# Patient Record
Sex: Male | Born: 1945 | Race: White | Hispanic: No | Marital: Married | Smoking: Never smoker
Health system: Southern US, Community
[De-identification: ages and names within clinical notes are randomized; demographics above are authoritative.]

## PROBLEM LIST (undated history)

## (undated) DIAGNOSIS — C61 Malignant neoplasm of prostate: Secondary | ICD-10-CM

## (undated) HISTORY — DX: Malignant neoplasm of prostate: C61

---

## 2014-12-30 DIAGNOSIS — E780 Pure hypercholesterolemia: Secondary | ICD-10-CM | POA: Diagnosis not present

## 2014-12-30 DIAGNOSIS — C61 Malignant neoplasm of prostate: Secondary | ICD-10-CM | POA: Diagnosis not present

## 2014-12-30 DIAGNOSIS — I10 Essential (primary) hypertension: Secondary | ICD-10-CM | POA: Diagnosis not present

## 2014-12-30 DIAGNOSIS — R7301 Impaired fasting glucose: Secondary | ICD-10-CM | POA: Diagnosis not present

## 2014-12-30 DIAGNOSIS — J449 Chronic obstructive pulmonary disease, unspecified: Secondary | ICD-10-CM | POA: Diagnosis not present

## 2015-03-05 DIAGNOSIS — J449 Chronic obstructive pulmonary disease, unspecified: Secondary | ICD-10-CM | POA: Diagnosis not present

## 2015-03-05 DIAGNOSIS — Z1211 Encounter for screening for malignant neoplasm of colon: Secondary | ICD-10-CM | POA: Diagnosis not present

## 2015-03-05 DIAGNOSIS — Z0000 Encounter for general adult medical examination without abnormal findings: Secondary | ICD-10-CM | POA: Diagnosis not present

## 2015-04-01 ENCOUNTER — Encounter: Payer: Self-pay | Admitting: Internal Medicine

## 2015-04-01 ENCOUNTER — Ambulatory Visit (INDEPENDENT_AMBULATORY_CARE_PROVIDER_SITE_OTHER): Payer: Commercial Managed Care - HMO | Admitting: Internal Medicine

## 2015-04-01 ENCOUNTER — Ambulatory Visit (INDEPENDENT_AMBULATORY_CARE_PROVIDER_SITE_OTHER)
Admission: RE | Admit: 2015-04-01 | Discharge: 2015-04-01 | Disposition: A | Discharge status: Home / Self Care | Payer: Commercial Managed Care - HMO | Source: Ambulatory Visit | Attending: Internal Medicine | Admitting: Internal Medicine

## 2015-04-01 VITALS — BP 140/98 | HR 85 | Ht 73.0 in | Wt 223.4 lb

## 2015-04-01 DIAGNOSIS — R0600 Dyspnea, unspecified: Secondary | ICD-10-CM

## 2015-04-01 NOTE — Progress Notes (Addendum)
Subjective:    Patient ID: Seth Oconnor, male    DOB: 11-24-1946,    MRN: [id number]  HPI  65 yowm never smoker no respiratory problems whatsoever until admitted to [facility] around 2012 with dx of copd rx spiriva and symbicort and about 50 % better but did not restore him back to nl activity then worse again since summer of 2015 and gradually worse doe since then so referred 04/01/2015  by Dr Olivia Canter for eval sob.   04/01/2015 1st [facility] office visit/ [place]   Chief Complaint  Patient presents with  . Pulmonary Consult    Referred by Dr. Rochel Brome. Pt states that he was dxed with COPD back in 2012. He c/o SOB worsening for the past 2 yrs.  He gets SOB when he goes outside and does anything, including walking to the mailbox. He was walking 5 miles per day approx 3 yrs ago.   doe x mailbox s stopping easier back cause downhill x tenth of a mile at what he considers a nl pace /worse in hot weather since breathing worse has noted more nasal  congestion and some sneeezing but no previous h/o atopy. Baseline wt around 200   Neg cardiac stress test per pt report "did better than they expected"  Never tried using saba before desired activity to see if helped tolerance, uses only after "overdoes it" and sits down.    No obvious  day to day or daytime variabilty or assoc chronic cough or cp or chest tightness, subjective wheeze overt  hb symptoms. No unusual exp hx or h/o childhood pna/ asthma or knowledge of premature birth.  Sleeping ok without nocturnal  or early am exacerbation  of respiratory  c/o's or need for noct saba. Also denies any obvious fluctuation of symptoms with weather or environmental changes or other aggravating or alleviating factors except as outlined above   Current Medications, Allergies, Complete Past Medical History, Past Surgical History, Family History, and Social History were reviewed in [employer] record.          Review of  Systems  Constitutional: Negative for fever, chills, activity change, appetite change and unexpected weight change.  HENT: Positive for congestion and sneezing. Negative for dental problem, postnasal drip, rhinorrhea, sore throat, trouble swallowing and voice change.   Eyes: Negative for visual disturbance.  Respiratory: Positive for shortness of breath. Negative for cough and choking.   Cardiovascular: Negative for chest pain and leg swelling.  Gastrointestinal: Negative for nausea, vomiting and abdominal pain.  Genitourinary: Negative for difficulty urinating.  Musculoskeletal: Negative for arthralgias.  Skin: Positive for rash.  Psychiatric/Behavioral: Negative for behavioral problems and confusion.       Objective:   Physical Exam  amb hoarse wm nad  Wt Readings from Last 3 Encounters:  04/01/15 223 lb 6.4 oz (101.334 kg)    Vital signs reviewed    HEENT: nl dentition, turbinates, and orophanx. Nl external ear canals without cough reflex   NECK :  without JVD/Nodes/TM/ nl carotid upstrokes bilaterally   LUNGS: no acc muscle use, clear to A and P bilaterally without cough on insp or exp maneuvers   CV:  RRR  no s3 or murmur or increase in P2, no edema   ABD:  Obese/ soft and nontender with nl excursion in the supine position. No bruits or organomegaly, bowel sounds nl  MS:  warm without deformities, calf tenderness, cyanosis or clubbing  SKIN: warm and dry  without lesions    NEURO:  alert, approp, no deficits   CXR PA and Lateral:   04/01/2015 :     I personally reviewed images and agree with radiology impression as follows:     No active cardiopulmonary disease.  Labs 02/24/15  nl cbc, lfts chem 6 per Dr Cox's office        Assessment & Plan:

## 2015-04-01 NOTE — Assessment & Plan Note (Addendum)
-    04/01/2015  Walked RA x 3 laps @ 185 ft each stopped due to end of study, no sob or desat -  Spirometry 04/01/2015 > no airflow obst even by fef 25-75  -  04/01/2015 rec trial of gerd rx   Symptoms are markedly disproportionate to objective findings and not clear this is a lung problem but pt does appear to have difficult airway management issues. DDX of  difficult airways management all start with A and  include Adherence, Ace Inhibitors, Acid Reflux, Active Sinus Disease, Alpha 1 Antitripsin deficiency, Anxiety masquerading as Airways dz,  ABPA,  allergy(esp in young), Aspiration (esp in elderly), Adverse effects of DPI,  Active smokers, plus two Bs  = Bronchiectasis and Beta blocker use..and one C= CHF   Adherence is always the initial "prime suspect" and is a multilayered concern that requires a "trust but verify" approach in every patient - starting with knowing how to use medications, especially inhalers, correctly, keeping up with refills and understanding the fundamental difference between maintenance and prns vs those medications only taken for a very short course and then stopped and not refilled.  - The proper method of use, as well as anticipated side effects, of a metered-dose inhaler are discussed and demonstrated to the patient. Improved effectiveness after extensive coaching during this visit to a level of approximately  75% so continue symbicort 160 2bid     ? Acid (or non-acid) GERD > always difficult to exclude as up to 75% of pts in some series report no assoc GI/ Heartburn symptoms> rec max (24h)  acid suppression and diet restrictions/ reviewed and instructions given in writing.    ? Adverse effects of dpi > try off spiriva since he has no copd  ? chf > not specifically excluded at this point   ?anxiety/ depression/ deconditioning > usually dx of exclusion but needs vigorous efforts to build back up to previous level of activity tolerance and if not able to make progress then  cpst

## 2015-04-01 NOTE — Patient Instructions (Addendum)
Stop spiriva - you do not have any copd   Continue symbicort Take 2 puffs first thing in am and then another 2 puffs about 12 hours later.   prilosec 20 mg x 2 (or the 40 mg) Take 30-60 min before first meal of the day   GERD (REFLUX)  is an extremely common cause of respiratory symptoms just like yours , many times with no obvious heartburn at all.    It can be treated with medication, but also with lifestyle changes including avoidance of late meals, excessive alcohol, smoking cessation, and avoid fatty foods, chocolate, peppermint, colas, red wine, and acidic juices such as orange juice.  NO MINT OR MENTHOL PRODUCTS SO NO COUGH DROPS  USE SUGARLESS CANDY INSTEAD (Jolley ranchers or Stover's or Life Savers) or even ice chips will also do - the key is to swallow to prevent all throat clearing. NO OIL BASED VITAMINS - use powdered substitutes.   Weight control is simply a matter of calorie balance which needs to be tilted in your favor by eating less and exercising more.  To get the most out of exercise, you need to be continuously aware that you are short of breath, but never out of breath, for 30 minutes daily. As you improve, it will actually be easier for you to do the same amount of exercise  in  30 minutes so always push to the level where you are short of breath.  If this does not result in gradual weight reduction then I strongly recommend you see a nutritionist with a food diary x 2 weeks so that we can work out a negative calorie balance which is universally effective in steady weight loss programs.  Think of your calorie balance like you do your bank account where in this case you want the balance to go down so you must take in less calories than you burn up.  It's just that simple:  Hard to do, but easy to understand.  Good luck!   Please schedule a follow up office visit in 6 weeks, call sooner if needed with pfts

## 2015-04-01 NOTE — Progress Notes (Signed)
Quick Note:  ATC, NA and no option to leave msg ______ 

## 2015-04-02 NOTE — Progress Notes (Signed)
Quick Note:  Called and spoke to pt. Informed pt of the results and recs per MW. Pt verbalized understanding and denied any further questions or concerns at this time.   ______ 

## 2015-05-13 ENCOUNTER — Ambulatory Visit (INDEPENDENT_AMBULATORY_CARE_PROVIDER_SITE_OTHER): Payer: Commercial Managed Care - HMO | Admitting: Internal Medicine

## 2015-05-13 ENCOUNTER — Encounter: Payer: Self-pay | Admitting: Internal Medicine

## 2015-05-13 VITALS — BP 142/82 | HR 79 | Ht 73.0 in | Wt 211.0 lb

## 2015-05-13 DIAGNOSIS — R0600 Dyspnea, unspecified: Secondary | ICD-10-CM

## 2015-05-13 LAB — PULMONARY FUNCTION TEST
DL/VA % pred: 87 %
DL/VA: 4.18 ml/min/mmHg/L
DLCO UNC % PRED: 84 %
DLCO UNC: 30.7 ml/min/mmHg
FEF 25-75 POST: 4.39 L/s
FEF 25-75 PRE: 3.67 L/s
FEF2575-%CHANGE-POST: 19 %
FEF2575-%PRED-POST: 155 %
FEF2575-%Pred-Pre: 129 %
FEV1-%CHANGE-POST: 4 %
FEV1-%Pred-Post: 112 %
FEV1-%Pred-Pre: 107 %
FEV1-POST: 4.16 L
FEV1-Pre: 3.98 L
FEV1FVC-%Change-Post: 2 %
FEV1FVC-%PRED-PRE: 106 %
FEV6-%Change-Post: 2 %
FEV6-%PRED-POST: 109 %
FEV6-%Pred-Pre: 106 %
FEV6-PRE: 5.02 L
FEV6-Post: 5.17 L
FEV6FVC-%Change-Post: 0 %
FEV6FVC-%PRED-PRE: 104 %
FEV6FVC-%Pred-Post: 105 %
FVC-%Change-Post: 2 %
FVC-%Pred-Post: 103 %
FVC-%Pred-Pre: 101 %
FVC-POST: 5.17 L
FVC-Pre: 5.07 L
POST FEV1/FVC RATIO: 80 %
Post FEV6/FVC ratio: 100 %
Pre FEV1/FVC ratio: 79 %
Pre FEV6/FVC Ratio: 99 %

## 2015-05-13 NOTE — Progress Notes (Signed)
PFT done today. 

## 2015-05-13 NOTE — Assessment & Plan Note (Addendum)
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  Please see instructions for details which were reviewed in writing and the patient given a copy.    5) pulmonary f/u is prn

## 2015-05-13 NOTE — Patient Instructions (Addendum)
Ok to try wean the symbicort first by dropping the pm dose, then reduce the am dose to one pff daily for a couple of weeks before stopping   Keep up the weight loss    If you are satisfied with your treatment plan,  let your doctor know and he/she can either refill your medications or you can return here when your prescription runs out.     If in any way you are not 100% satisfied,  please tell us.  If 100% better, tell your friends!  Pulmonary follow up is as needed

## 2015-05-13 NOTE — Progress Notes (Addendum)
Subjective:    Patient ID: Seth Oconnor, male    DOB: [birth year]    MRN: [id number]    Brief patient profile:  76 yowm never smoker no respiratory problems whatsoever until admitted to [facility] around 2012 with dx of copd rx spiriva and symbicort and about 50 % better but did not restore him back to nl activity then worse again since summer of 2015 and gradually worse doe since then so referred 04/01/2015  by Dr Olivia Canter for eval sob with no evidence of  Airflow obst at all by pfts 05/13/2015     History of Present Illness  04/01/2015 1st [facility] office visit/ [place]   Chief Complaint  Patient presents with  . Pulmonary Consult    Referred by Dr. Rochel Brome. Pt states that he was dxed with COPD back in 2012. He c/o SOB worsening for the past 2 yrs.  He gets SOB when he goes outside and does anything, including walking to the mailbox. He was walking 5 miles per day approx 3 yrs ago.   doe x mailbox s stopping easier back cause downhill x tenth of a mile at what he considers a nl pace /worse in hot weather since breathing worse has noted more nasal  congestion and some sneeezing but no previous h/o atopy. Baseline wt around 200   Neg cardiac stress test per pt report "did better than they expected"  Never tried using saba before desired activity to see if helped tolerance, uses only after "overdoes it" and sits down.   rec Stop spiriva - you do not have any copd  Continue symbicort Take 2 puffs first thing in am and then another 2 puffs about 12 hours later.  Prilosec 20 mg x 2 (or the 40 mg) Take 30-60 min before first meal of the day  GERD diet Try for neg cal bal    05/13/2015 f/u ov/Jaelynn Pozo re: unexplained doe better on gerd rx and off spiriva/ still on symbicort 160 2bid  Chief Complaint  Patient presents with  . Follow-up    Pt states his breathing has improved back to his normal baseline. He states that he is back to walking 5 miles per day.   never using the saba in any  form   Not limited by breathing from desired activities    No obvious day to day or daytime variabilty or assoc chronic cough or cp or chest tightness, subjective wheeze overt sinus or hb symptoms. No unusual exp hx or h/o childhood pna/ asthma or knowledge of premature birth.  Sleeping ok without nocturnal  or early am exacerbation  of respiratory  c/o's or need for noct saba. Also denies any obvious fluctuation of symptoms with weather or environmental changes or other aggravating or alleviating factors except as outlined above   Current Medications, Allergies, Complete Past Medical History, Past Surgical History, Family History, and Social History were reviewed in [employer] record.  ROS  The following are not active complaints unless bolded sore throat, dysphagia, dental problems, itching, sneezing,  nasal congestion or excess/ purulent secretions, ear ache,   fever, chills, sweats, unintended wt loss, pleuritic or exertional cp, hemoptysis,  orthopnea pnd or leg swelling, presyncope, palpitations, heartburn, abdominal pain, anorexia, nausea, vomiting, diarrhea  or change in bowel or urinary habits, change in stools or urine, dysuria,hematuria,  rash, arthralgias, visual complaints, headache, numbness weakness or ataxia or problems with walking or coordination,  change in mood/affect or memory.  Objective:   Physical Exam  amb hoarse wm nad  Wt Readings from Last 3 Encounters:  05/13/15 211 lb (95.709 kg)  04/01/15 223 lb 6.4 oz (101.334 kg)    Vital signs reviewed    HEENT: nl dentition, turbinates, and orophanx. Nl external ear canals without cough reflex   NECK :  without JVD/Nodes/TM/ nl carotid upstrokes bilaterally   LUNGS: no acc muscle use, clear to A and P bilaterally without cough on insp or exp maneuvers   CV:  RRR  no s3 or murmur or increase in P2, no edema   ABD:  Obese/ soft and nontender with nl excursion in the supine  position. No bruits or organomegaly, bowel sounds nl  MS:  warm without deformities, calf tenderness, cyanosis or clubbing  SKIN: warm and dry without lesions    NEURO:  alert, approp, no deficits   CXR PA and Lateral:   04/01/2015 :     I personally reviewed images and agree with radiology impression as follows:     No active cardiopulmonary disease.  Labs 02/24/15  nl cbc, lfts chem 6 per Dr Cox's office        Assessment & Plan:   Outpatient Encounter Prescriptions as of 05/13/2015  Medication Sig  . albuterol (PROVENTIL HFA;VENTOLIN HFA) 108 (90 BASE) MCG/ACT inhaler Inhale 2 puffs into the lungs every 6 (six) hours as needed for wheezing or shortness of breath.  Marland Kitchen albuterol (PROVENTIL) (2.5 MG/3ML) 0.083% nebulizer solution Take 2.5 mg by nebulization every 6 (six) hours as needed for wheezing or shortness of breath.  Marland Kitchen aspirin 81 MG tablet Take 81 mg by mouth daily.  . budesonide-formoterol (SYMBICORT) 160-4.5 MCG/ACT inhaler Inhale 2 puffs into the lungs 2 (two) times daily.  . Cholecalciferol (VITAMIN D3) 5000 UNITS TABS Take 1 tablet by mouth daily.  Marland Kitchen escitalopram (LEXAPRO) 10 MG tablet Take 10 mg by mouth daily.  . finasteride (PROSCAR) 5 MG tablet Take 5 mg by mouth daily.  . Multiple Vitamin (MULTIVITAMIN) capsule Take 1 capsule by mouth daily.  Marland Kitchen omeprazole (PRILOSEC) 20 MG capsule Take 40 mg by mouth daily.  . tamsulosin (FLOMAX) 0.4 MG CAPS capsule Take 0.4 mg by mouth daily.  . Vitamins-Lipotropics (LIPO-FLAVONOID PLUS) TABS Take 3 tablets by mouth daily.  . [DISCONTINUED] losartan (COZAAR) 25 MG tablet Take 25 mg by mouth daily.  . [DISCONTINUED] omeprazole (PRILOSEC) 20 MG capsule Take 20 mg by mouth daily.   No facility-administered encounter medications on file as of 05/13/2015.

## 2015-05-26 ENCOUNTER — Institutional Professional Consult (permissible substitution): Payer: Self-pay | Admitting: Critical Care Medicine

## 2015-10-20 DIAGNOSIS — H259 Unspecified age-related cataract: Secondary | ICD-10-CM | POA: Diagnosis not present

## 2015-10-20 DIAGNOSIS — Z0100 Encounter for examination of eyes and vision without abnormal findings: Secondary | ICD-10-CM | POA: Diagnosis not present

## 2015-12-31 DIAGNOSIS — M84364A Stress fracture, left fibula, initial encounter for fracture: Secondary | ICD-10-CM | POA: Diagnosis not present

## 2016-03-09 DIAGNOSIS — Z0000 Encounter for general adult medical examination without abnormal findings: Secondary | ICD-10-CM | POA: Diagnosis not present

## 2016-03-09 DIAGNOSIS — Z6827 Body mass index (BMI) 27.0-27.9, adult: Secondary | ICD-10-CM | POA: Diagnosis not present

## 2016-05-03 DIAGNOSIS — J301 Allergic rhinitis due to pollen: Secondary | ICD-10-CM | POA: Diagnosis not present

## 2016-07-13 DIAGNOSIS — L403 Pustulosis palmaris et plantaris: Secondary | ICD-10-CM | POA: Diagnosis not present

## 2016-08-12 ENCOUNTER — Encounter: Payer: Self-pay | Admitting: Internal Medicine

## 2016-08-12 ENCOUNTER — Ambulatory Visit (INDEPENDENT_AMBULATORY_CARE_PROVIDER_SITE_OTHER): Payer: Commercial Managed Care - HMO | Admitting: Internal Medicine

## 2016-08-12 VITALS — BP 130/68 | HR 97 | Temp 97.6°F | Ht 73.0 in | Wt 209.6 lb

## 2016-08-12 DIAGNOSIS — J45901 Unspecified asthma with (acute) exacerbation: Secondary | ICD-10-CM

## 2016-08-12 DIAGNOSIS — R0600 Dyspnea, unspecified: Secondary | ICD-10-CM

## 2016-08-12 LAB — NITRIC OXIDE: Nitric Oxide: 180

## 2016-08-12 MED ORDER — BUDESONIDE-FORMOTEROL FUMARATE 160-4.5 MCG/ACT IN AERO: 2.0000 | INHALATION_SPRAY | Freq: Two times a day (BID) | RESPIRATORY_TRACT | 0 refills | Status: AC

## 2016-08-12 MED ORDER — PREDNISONE 10 MG PO TABS: ORAL_TABLET | ORAL | 0 refills | Status: DC

## 2016-08-12 NOTE — Patient Instructions (Addendum)
prilosec should be Take 30-60 min before first meal of the day and add pepcid 20 mg at bedtime   Work on inhaler technique:  relax and gently blow all the way out then take a nice smooth deep breath back in, triggering the inhaler at same time you start breathing in.  Hold for up to 5 seconds if you can. Blow out thru nose. Rinse and gargle with water when done      Plan A = Automatic = Symbicort 160 up to 2 pffs every 12 hours                                     Prednisone 10 mg take  4 each am x 2 days,   2 each am x 2 days,  1 each am x 2 days and stop   Plan B = Backup Only use your albuterol as a rescue medication to be used if you can't catch your breath by resting or doing a relaxed purse lip breathing pattern.  - The less you use it, the better it will work when you need it. - Ok to use the inhaler up to 2 puffs  every 4 hours if you must but call for appointment if use goes up over your usual need - Don't leave home without it !!  (think of it like the spare tire for your car)   Plan C = Crisis - only use your albuterol nebulizer if you first try Plan B and it fails to help > ok to use the nebulizer up to every 4 hours but if start needing it regularly call for immediate appointment   Please schedule a follow up office visit in 6 weeks, call sooner if needed

## 2016-08-12 NOTE — Progress Notes (Signed)
Subjective:    Patient ID: Seth Oconnor, male    DOB: [birth year]    MRN: [id number]    Brief patient profile:  32 yowm never smoker no respiratory problems whatsoever until admitted to [facility] around 2012 with dx of copd rx spiriva and symbicort and about 50 % better but did not restore him back to nl activity then worse again since summer of 2015 and gradually worse doe since then so referred 04/01/2015  by Seth Oconnor for eval sob with no evidence of  Airflow obst at all by pfts 05/13/2015     History of Present Illness  04/01/2015 1st [facility] office visit/ Seth Oconnor   Chief Complaint  Patient presents with  . Pulmonary Consult    Referred by Seth Oconnor. Pt states that he was dxed with COPD back in 2012. He c/o SOB worsening for the past 2 yrs.  He gets SOB when he goes outside and does anything, including walking to the mailbox. He was walking 5 miles per day approx 3 yrs ago.   doe x mailbox s stopping easier back cause downhill x tenth of a mile at what he considers a nl pace /worse in hot weather since breathing worse has noted more nasal  congestion and some sneeezing but no previous h/o atopy. Baseline wt around 200   Neg cardiac stress test per pt report "did better than they expected"  Never tried using saba before desired activity to see if helped tolerance, uses only after "overdoes it" and sits down.   rec Stop spiriva - you do not have any copd  Continue symbicort Take 2 puffs first thing in am and then another 2 puffs about 12 hours later.  Prilosec 20 mg x 2 (or the 40 mg) Take 30-60 min before first meal of the day  GERD diet Try for neg cal bal    05/13/2015 f/u ov/Seth Oconnor re: unexplained doe better on gerd rx and off spiriva/ still on symbicort 160 2bid  Chief Complaint  Patient presents with  . Follow-up    Pt states his breathing has improved back to his normal baseline. He states that he is back to walking 5 miles per day.   never using the saba in any  form  Not limited by breathing from desired activities   rec Ok to try wean the symbicort first by dropping the pm dose, then reduce the am dose to one pff daily for a couple of weeks before stopping  Keep up the weight loss    08/12/2016 acute extended ov/Seth Oconnor re: recurrent cough x early July 2017 Chief Complaint  Patient presents with  . Acute Visit    c/o worsening prod cough with clear mucus qhs, sob.  S/s present X1 month.     onset was x one month prior to [place] with nasal congestion / throat congestion rec zyrtec by PC > not better Taking ppi p supper  Coughs so hard at hs loses breath   No obvious day to day or daytime variability or assoc   purulent sputum or mucus plugs or hemoptysis or cp or chest tightness, subjective wheeze or overt sinus or hb symptoms. No unusual exp hx or h/o childhood pna/ asthma or knowledge of premature birth.   Also denies any obvious fluctuation of symptoms with weather or environmental changes or other aggravating or alleviating factors except as outlined above   Current Medications, Allergies, Complete Past Medical History, Past Surgical  History, Family History, and Social History were reviewed in [employer] record.  ROS  The following are not active complaints unless bolded sore throat, dysphagia, dental problems, itching, sneezing,  nasal congestion or excess/ purulent secretions, ear ache,   fever, chills, sweats, unintended wt loss, classically pleuritic or exertional cp,  orthopnea pnd or leg swelling, presyncope, palpitations, abdominal pain, anorexia, nausea, vomiting, diarrhea  or change in bowel or bladder habits, change in stools or urine, dysuria,hematuria,  rash, arthralgias, visual complaints, headache, numbness, weakness or ataxia or problems with walking or coordination,  change in mood/affect or memory.                 Objective:   Physical Exam  amb hoarse wm nad/ slight nasal tone to voice    08/12/2016        209   05/13/15 211 lb (95.709 kg)  04/01/15 223 lb 6.4 oz (101.334 kg)    Vital signs reviewed    HEENT: nl dentition, turbinates, and orophanx. Nl external ear canals without cough reflex   NECK :  without JVD/Nodes/TM/ nl carotid upstrokes bilaterally   LUNGS: no acc muscle use, prominent pseudowheeze with lots of transmitted upper airway noises on insp/ true wheeze on exp bilaterally      CV:  RRR  no s3 or murmur or increase in P2, no edema   ABD:  Obese/ soft and nontender with nl excursion in the supine position. No bruits or organomegaly, bowel sounds nl  MS:  warm without deformities, calf tenderness, cyanosis or clubbing  SKIN: warm and dry without lesions    NEURO:  alert, approp, no deficits           Assessment & Plan:

## 2016-08-14 ENCOUNTER — Encounter: Payer: Self-pay | Admitting: Internal Medicine

## 2016-08-14 DIAGNOSIS — J45901 Unspecified asthma with (acute) exacerbation: Secondary | ICD-10-CM | POA: Insufficient documentation

## 2016-08-14 NOTE — Assessment & Plan Note (Addendum)
-    04/01/2015  Walked RA x 3 laps @ 185 ft each stopped due to end of study, no sob or desat -  Spirometry 04/01/2015 > no airflow obst even by fef 25-75  -  04/01/2015 rec trial of gerd rx - PFT  05/13/2015  wnl > ok to wean off symbicort > flared with def airflow obst 08/12/2016 - see asthma   Clearly he has two different problems as prev c/o doe with nl pfts so rec maint rx for gerd and regroup in 4 weeks to sort out the components of his ongoing chronic doe.

## 2016-08-14 NOTE — Assessment & Plan Note (Addendum)
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 so rec  Prednisone 10 mg take  4 each am x 2 days,   2 each am x 2 days,  1 each am x 2 days and stop Resume symbicort 160 2bid  Max rx for gerd for now  F/u in 6 weeks  I had an extended discussion with the patient reviewing all relevant studies completed to date and  lasting 15 to 20 minutes of a 25 minute visit    Each maintenance medication was reviewed in detail including most importantly the difference between maintenance and prns and under what circumstances the prns are to be triggered using an action plan format that is not reflected in the computer generated alphabetically organized AVS.    Please see instructions for details which were reviewed in writing and the patient given a copy highlighting the part that I personally wrote and discussed at today's ov.

## 2016-08-31 DIAGNOSIS — C61 Malignant neoplasm of prostate: Secondary | ICD-10-CM | POA: Diagnosis not present

## 2016-09-07 DIAGNOSIS — C61 Malignant neoplasm of prostate: Secondary | ICD-10-CM | POA: Diagnosis not present

## 2016-09-09 DIAGNOSIS — C439 Malignant melanoma of skin, unspecified: Secondary | ICD-10-CM | POA: Diagnosis not present

## 2016-09-09 DIAGNOSIS — C61 Malignant neoplasm of prostate: Secondary | ICD-10-CM | POA: Diagnosis not present

## 2016-09-23 ENCOUNTER — Ambulatory Visit (INDEPENDENT_AMBULATORY_CARE_PROVIDER_SITE_OTHER): Payer: Commercial Managed Care - HMO | Admitting: Internal Medicine

## 2016-09-23 ENCOUNTER — Encounter: Payer: Self-pay | Admitting: Internal Medicine

## 2016-09-23 VITALS — BP 112/80 | HR 82 | Ht 73.0 in | Wt 213.4 lb

## 2016-09-23 DIAGNOSIS — J45901 Unspecified asthma with (acute) exacerbation: Secondary | ICD-10-CM | POA: Diagnosis not present

## 2016-09-23 NOTE — Assessment & Plan Note (Signed)
-    04/01/2015  Walked RA x 3 laps @ 185 ft each stopped due to end of study, no sob or desat -  Spirometry 04/01/2015 > no airflow obst even by fef 25-75  -  04/01/2015 rec trial of gerd rx - PFT  05/13/2015  wnl > ok to wean off symbicort   - Spirometry 08/12/2016  FEV1 2.08 (56%)  Ratio 63 with typical curve - FENO 08/12/2016  =   180    08/12/2016   resume symbicort 160  - 09/23/2016  After extensive coaching HFA effectiveness =    90%    All goals of chronic asthma control met including optimal function and elimination of symptoms with minimal need for rescue therapy.  Contingencies discussed in full including contacting this office immediately if not controlling the symptoms using the rule of two's.      I had an extended discussion with the patient reviewing all relevant studies completed to date and  lasting 15 to 20 minutes of a 25 minute visit on the following ongoing concerns:   1) side effects of ICS vs pred reviewed : The goal with a chronic steroid dependent illness is always arriving at the lowest effective dose that controls the disease/symptoms and not accepting a set "formula" which is based on statistics or guidelines that don't always take into account patient  variability or the natural hx of the dz in every individual patient, which may well vary over time.  For now therefore I recommend the patient maintain  Off systemic steroids and on lowest effective dose of ICS > for now symb 160 2 each am then 2 in pm prn (concerned with cataracts)   2) Each maintenance medication was reviewed in detail including most importantly the difference between maintenance and as needed and under what circumstances the prns are to be used.  Please see instructions for details which were reviewed in writing and the patient given a copy.

## 2016-09-23 NOTE — Progress Notes (Signed)
Subjective:    Patient ID: Seth Oconnor, male    DOB: August 21, 1946    MRN: [id number]    Brief patient profile:  68 yowm never smoker no respiratory problems whatsoever until admitted to [facility] around 2012 with dx of copd rx spiriva and symbicort and about 50 % better but did not restore him back to nl activity then worse again since summer of 2015 and gradually worse doe since then so referred 04/01/2015  by Dr Olivia Canter for eval sob with no evidence of  Airflow obst at all by pfts 05/13/2015     History of Present Illness  04/01/2015 1st [facility] office visit/ [facility]   Chief Complaint  Patient presents with  . Pulmonary Consult    Referred by Dr. Rochel Brome. Pt states that he was dxed with COPD back in 2012. He c/o SOB worsening for the past 2 yrs.  He gets SOB when he goes outside and does anything, including walking to the mailbox. He was walking 5 miles per day approx 3 yrs ago.   doe x mailbox s stopping easier back cause downhill x tenth of a mile at what he considers a nl pace /worse in hot weather since breathing worse has noted more nasal  congestion and some sneeezing but no previous h/o atopy. Baseline wt around 200   Neg cardiac stress test per pt report "did better than they expected"  Never tried using saba before desired activity to see if helped tolerance, uses only after "overdoes it" and sits down.   rec Stop spiriva - you do not have any copd  Continue symbicort Take 2 puffs first thing in am and then another 2 puffs about 12 hours later.  Prilosec 20 mg x 2 (or the 40 mg) Take 30-60 min before first meal of the day  GERD diet Try for neg cal bal    05/13/2015 f/u ov/Wert re: unexplained doe better on gerd rx and off spiriva/ still on symbicort 160 2bid  Chief Complaint  Patient presents with  . Follow-up    Pt states his breathing has improved back to his normal baseline. He states that he is back to walking 5 miles per day.   never using the saba in any  form  Not limited by breathing from desired activities   rec Ok to try wean the symbicort first by dropping the pm dose, then reduce the am dose to one pff daily for a couple of weeks before stopping  Keep up the weight loss    08/12/2016 acute extended ov/Wert re: recurrent cough x early July 2017/ on no inhalers  Chief Complaint  Patient presents with  . Acute Visit    c/o worsening prod cough with clear mucus qhs, sob.  S/s present X1 month.     onset was x one month prior to OV  Assoc with nasal congestion / throat congestion rec zyrtec by PC > not better Taking ppi p supper  Coughs so hard at hs loses breath  rec prilosec should be Take 30-60 min before first meal of the day and add pepcid 20 mg at bedtime  Work on inhaler technique:      Plan A = Automatic = Symbicort 160 up to 2 pffs every 12 hours  Prednisone 10 mg take  4 each am x 2 days,   2 each am x 2 days,  1 each am x 2 days and stop  Plan B = Backup Only use your albuterol as a rescue medication  Plan C = Crisis - only use your albuterol nebulizer if you first try Plan B    09/23/2016  f/u ov/Wert re: chronic asthma/  Chief Complaint  Patient presents with  . Follow-up    Breathing has improved and cough has basically resolved. No new co's today.    Not limited by breathing from desired activities      No obvious day to day or daytime variability or assoc excess/  purulent sputum or mucus plugs or hemoptysis or cp or chest tightness, subjective wheeze or overt sinus or hb symptoms. No unusual exp hx or h/o childhood pna/ asthma or knowledge of premature birth.   Also denies any obvious fluctuation of symptoms with weather or environmental changes or other aggravating or alleviating factors except as outlined above   Current Medications, Allergies, Complete Past Medical History, Past Surgical History, Family History, and Social History were reviewed in [employer] record.  ROS  The following are not active complaints unless bolded sore throat, dysphagia, dental problems, itching, sneezing,  nasal congestion or excess/ purulent secretions, ear ache,   fever, chills, sweats, unintended wt loss, classically pleuritic or exertional cp,  orthopnea pnd or leg swelling, presyncope, palpitations, abdominal pain, anorexia, nausea, vomiting, diarrhea  or change in bowel or bladder habits, change in stools or urine, dysuria,hematuria,  rash, arthralgias, visual complaints, headache, numbness, weakness or ataxia or problems with walking or coordination,  change in mood/affect or memory.                 Objective:   Physical Exam  amb hoarse wm nad/ slight nasal tone to voice    09/23/2016        214 08/12/2016        209   05/13/15 211 lb (95.709 kg)  04/01/15 223 lb 6.4 oz (101.334 kg)    Vital signs reviewed    HEENT: nl dentition, turbinates, and orophanx. Nl external ear canals without cough reflex   NECK :  without JVD/Nodes/TM/ nl carotid upstrokes bilaterally   LUNGS: no acc muscle use, clear to A and P    CV:  RRR  no s3 or murmur or increase in P2, no edema   ABD:  Obese/ soft and nontender with nl excursion in the supine position. No bruits or organomegaly, bowel sounds nl  MS:  warm without deformities, calf tenderness, cyanosis or clubbing  SKIN: warm and dry without lesions    NEURO:  alert, approp, no deficits           Assessment & Plan:

## 2016-09-23 NOTE — Patient Instructions (Signed)
   Plan A = Automatic = Symbicort 160 up to 2 pffs first thing in am and repeat in evening only if having respiratory symptoms     Plan B = Backup Only use your albuterol as a rescue medication to be used if you can't catch your breath by resting or doing a relaxed purse lip breathing pattern.  - The less you use it, the better it will work when you need it. - Ok to use the inhaler up to 2 puffs  every 4 hours if you must but call for appointment if use goes up over your usual need - Don't leave home without it !!  (think of it like the spare tire for your car)   Plan C = Crisis - only use your albuterol nebulizer if you first try Plan B and it fails to help > ok to use the nebulizer up to every 4 hours but if start needing it regularly call for immediate appointment   Please schedule a follow up visit in 3 months but call sooner if needed

## 2016-12-01 DIAGNOSIS — M25531 Pain in right wrist: Secondary | ICD-10-CM | POA: Diagnosis not present

## 2016-12-06 DIAGNOSIS — M25531 Pain in right wrist: Secondary | ICD-10-CM | POA: Diagnosis not present

## 2016-12-08 DIAGNOSIS — M25531 Pain in right wrist: Secondary | ICD-10-CM | POA: Diagnosis not present

## 2016-12-13 DIAGNOSIS — M25531 Pain in right wrist: Secondary | ICD-10-CM | POA: Diagnosis not present

## 2016-12-15 DIAGNOSIS — M25531 Pain in right wrist: Secondary | ICD-10-CM | POA: Diagnosis not present

## 2016-12-22 DIAGNOSIS — M25531 Pain in right wrist: Secondary | ICD-10-CM | POA: Diagnosis not present

## 2016-12-23 ENCOUNTER — Ambulatory Visit: Payer: Commercial Managed Care - HMO | Admitting: Internal Medicine

## 2016-12-27 DIAGNOSIS — M25531 Pain in right wrist: Secondary | ICD-10-CM | POA: Diagnosis not present

## 2016-12-28 ENCOUNTER — Ambulatory Visit: Payer: Commercial Managed Care - HMO | Admitting: Internal Medicine

## 2017-01-03 DIAGNOSIS — M25531 Pain in right wrist: Secondary | ICD-10-CM | POA: Diagnosis not present

## 2017-01-06 DIAGNOSIS — M25531 Pain in right wrist: Secondary | ICD-10-CM | POA: Diagnosis not present

## 2017-01-10 DIAGNOSIS — M25531 Pain in right wrist: Secondary | ICD-10-CM | POA: Diagnosis not present

## 2017-01-13 DIAGNOSIS — M25531 Pain in right wrist: Secondary | ICD-10-CM | POA: Diagnosis not present

## 2017-01-17 DIAGNOSIS — M25531 Pain in right wrist: Secondary | ICD-10-CM | POA: Diagnosis not present

## 2017-01-19 DIAGNOSIS — M25531 Pain in right wrist: Secondary | ICD-10-CM | POA: Diagnosis not present

## 2017-01-24 DIAGNOSIS — M25531 Pain in right wrist: Secondary | ICD-10-CM | POA: Diagnosis not present

## 2017-01-31 DIAGNOSIS — M25531 Pain in right wrist: Secondary | ICD-10-CM | POA: Diagnosis not present

## 2017-02-02 DIAGNOSIS — M25531 Pain in right wrist: Secondary | ICD-10-CM | POA: Diagnosis not present

## 2017-02-07 DIAGNOSIS — M25531 Pain in right wrist: Secondary | ICD-10-CM | POA: Diagnosis not present

## 2017-02-09 DIAGNOSIS — M25531 Pain in right wrist: Secondary | ICD-10-CM | POA: Diagnosis not present

## 2017-02-14 DIAGNOSIS — M25531 Pain in right wrist: Secondary | ICD-10-CM | POA: Diagnosis not present

## 2017-02-16 DIAGNOSIS — M25531 Pain in right wrist: Secondary | ICD-10-CM | POA: Diagnosis not present

## 2017-02-21 DIAGNOSIS — M25531 Pain in right wrist: Secondary | ICD-10-CM | POA: Diagnosis not present

## 2017-02-22 DIAGNOSIS — R070 Pain in throat: Secondary | ICD-10-CM | POA: Diagnosis not present

## 2017-02-22 DIAGNOSIS — J019 Acute sinusitis, unspecified: Secondary | ICD-10-CM | POA: Diagnosis not present

## 2017-02-22 DIAGNOSIS — R51 Headache: Secondary | ICD-10-CM | POA: Diagnosis not present

## 2017-02-22 DIAGNOSIS — R05 Cough: Secondary | ICD-10-CM | POA: Diagnosis not present

## 2017-02-23 DIAGNOSIS — M25531 Pain in right wrist: Secondary | ICD-10-CM | POA: Diagnosis not present

## 2017-02-27 DIAGNOSIS — H35363 Drusen (degenerative) of macula, bilateral: Secondary | ICD-10-CM | POA: Diagnosis not present

## 2017-02-27 DIAGNOSIS — H25813 Combined forms of age-related cataract, bilateral: Secondary | ICD-10-CM | POA: Diagnosis not present

## 2017-02-28 DIAGNOSIS — M25531 Pain in right wrist: Secondary | ICD-10-CM | POA: Diagnosis not present

## 2017-03-02 DIAGNOSIS — M25531 Pain in right wrist: Secondary | ICD-10-CM | POA: Diagnosis not present

## 2017-03-07 DIAGNOSIS — M25531 Pain in right wrist: Secondary | ICD-10-CM | POA: Diagnosis not present

## 2017-03-09 DIAGNOSIS — M25531 Pain in right wrist: Secondary | ICD-10-CM | POA: Diagnosis not present

## 2017-03-11 IMAGING — CR DG CHEST 2V
2 series · 2 of 2 positions shown · non-contrast
Comparison: None.

CLINICAL DATA: Dyspnea.

EXAM:
CHEST  2 VIEW

[view not recorded (1 of 2)]
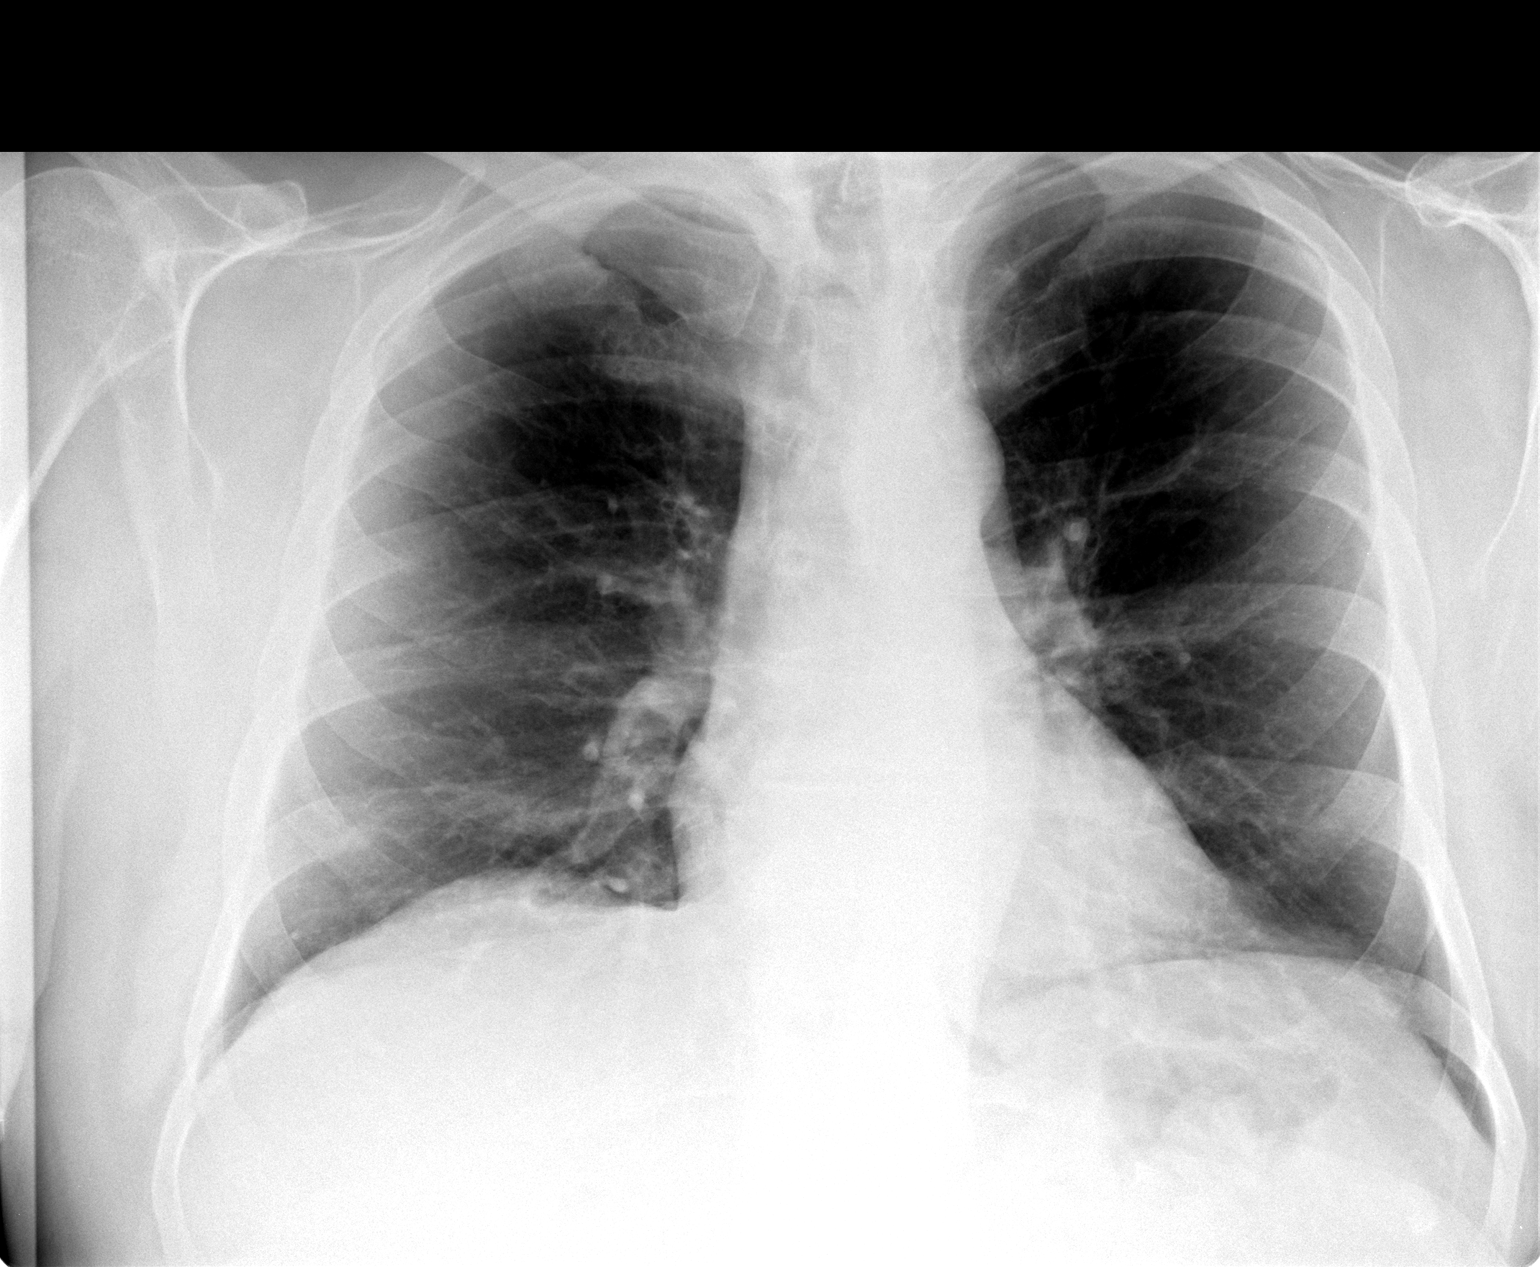

[view not recorded (2 of 2)]
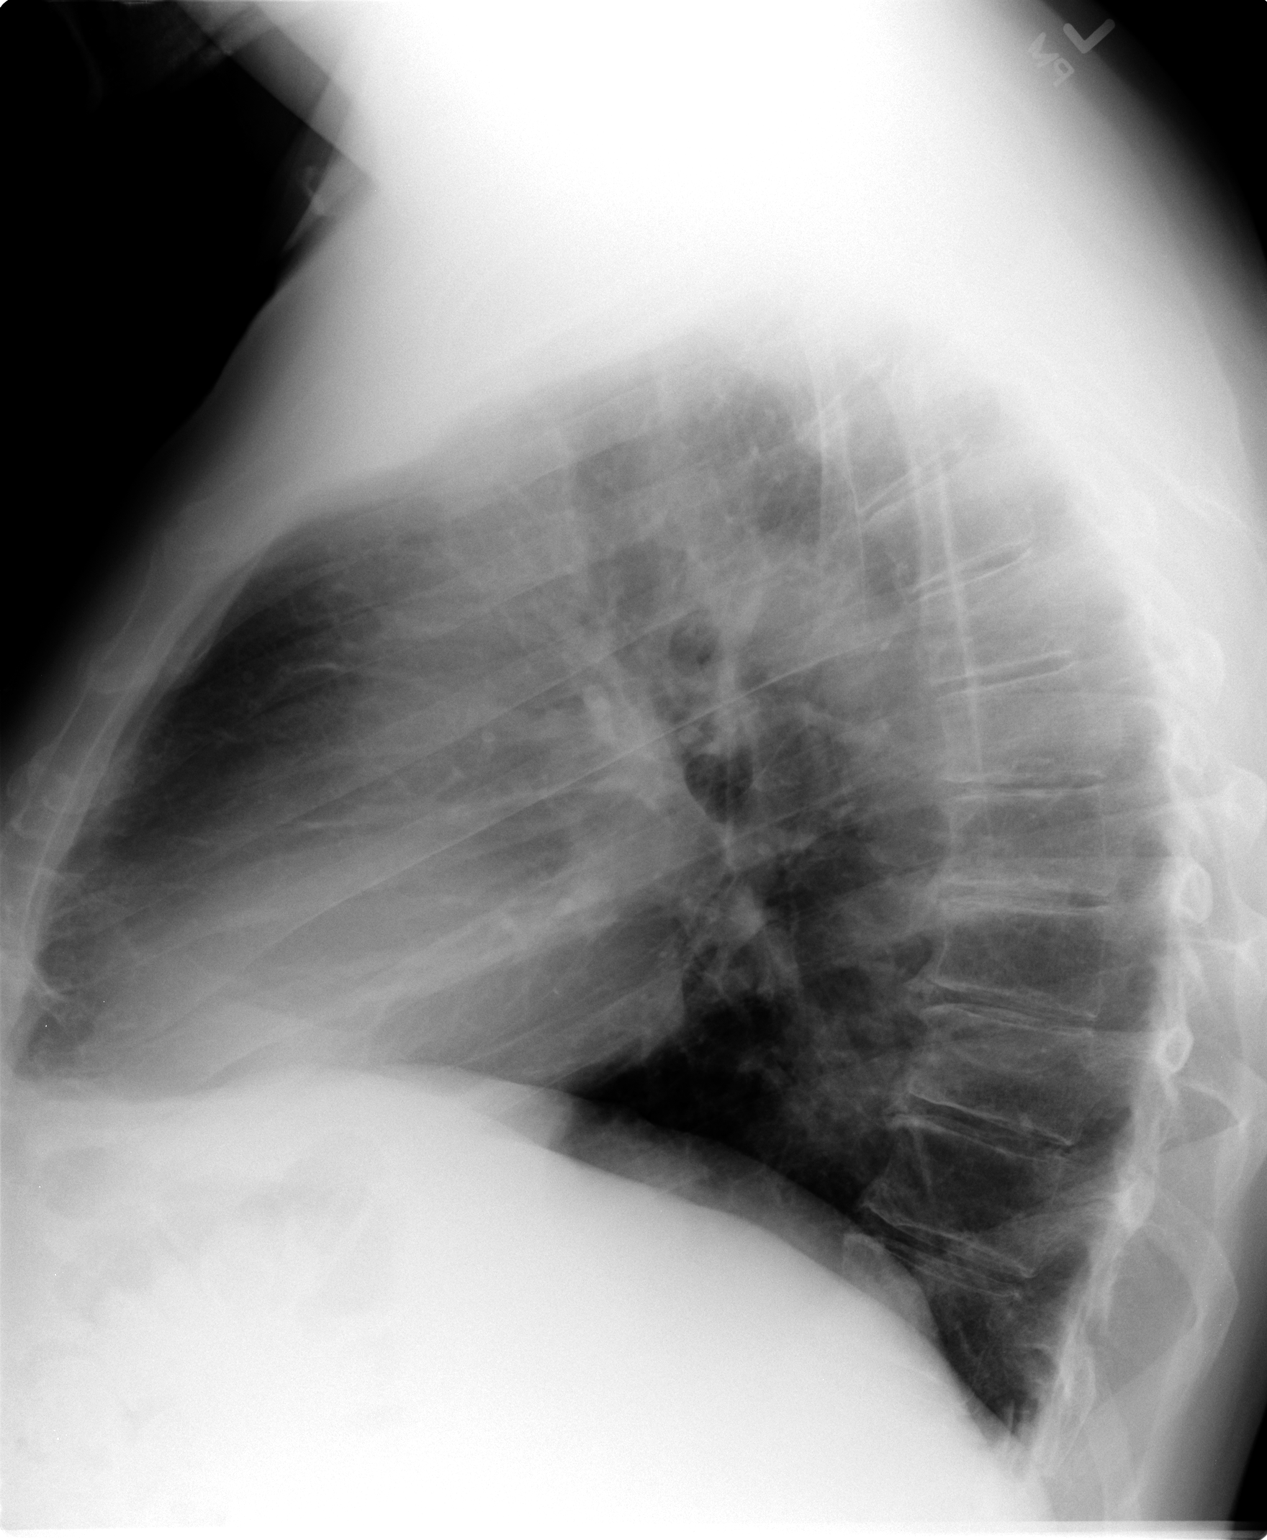

[2 of 2 positions shown; findings below may reference images not displayed]

FINDINGS: The heart size and mediastinal contours are within normal limits.
Both lungs are clear. No pneumothorax or pleural effusion is noted.
The visualized skeletal structures are unremarkable.
IMPRESSION: No active cardiopulmonary disease.

## 2017-03-14 DIAGNOSIS — M25531 Pain in right wrist: Secondary | ICD-10-CM | POA: Diagnosis not present

## 2017-03-21 DIAGNOSIS — M25531 Pain in right wrist: Secondary | ICD-10-CM | POA: Diagnosis not present

## 2017-03-23 DIAGNOSIS — M25531 Pain in right wrist: Secondary | ICD-10-CM | POA: Diagnosis not present

## 2017-03-25 DIAGNOSIS — R21 Rash and other nonspecific skin eruption: Secondary | ICD-10-CM | POA: Diagnosis not present

## 2017-03-25 DIAGNOSIS — C439 Malignant melanoma of skin, unspecified: Secondary | ICD-10-CM | POA: Diagnosis not present

## 2017-03-25 DIAGNOSIS — T421X5A Adverse effect of iminostilbenes, initial encounter: Secondary | ICD-10-CM | POA: Diagnosis not present

## 2017-03-25 DIAGNOSIS — Z8546 Personal history of malignant neoplasm of prostate: Secondary | ICD-10-CM | POA: Diagnosis not present

## 2017-03-25 DIAGNOSIS — J45909 Unspecified asthma, uncomplicated: Secondary | ICD-10-CM | POA: Diagnosis not present

## 2017-03-25 DIAGNOSIS — R05 Cough: Secondary | ICD-10-CM | POA: Diagnosis not present

## 2017-03-25 DIAGNOSIS — L270 Generalized skin eruption due to drugs and medicaments taken internally: Secondary | ICD-10-CM | POA: Diagnosis not present

## 2017-03-25 DIAGNOSIS — L986 Other infiltrative disorders of the skin and subcutaneous tissue: Secondary | ICD-10-CM | POA: Diagnosis not present

## 2017-03-25 DIAGNOSIS — R918 Other nonspecific abnormal finding of lung field: Secondary | ICD-10-CM | POA: Diagnosis not present

## 2017-03-25 DIAGNOSIS — Z01818 Encounter for other preprocedural examination: Secondary | ICD-10-CM | POA: Diagnosis not present

## 2017-03-25 DIAGNOSIS — B379 Candidiasis, unspecified: Secondary | ICD-10-CM | POA: Diagnosis not present

## 2017-03-25 DIAGNOSIS — H109 Unspecified conjunctivitis: Secondary | ICD-10-CM | POA: Diagnosis not present

## 2017-03-25 DIAGNOSIS — R1310 Dysphagia, unspecified: Secondary | ICD-10-CM | POA: Diagnosis not present

## 2017-03-25 DIAGNOSIS — L432 Lichenoid drug reaction: Secondary | ICD-10-CM | POA: Diagnosis not present

## 2017-03-25 DIAGNOSIS — K1379 Other lesions of oral mucosa: Secondary | ICD-10-CM | POA: Diagnosis not present

## 2017-03-25 DIAGNOSIS — R0989 Other specified symptoms and signs involving the circulatory and respiratory systems: Secondary | ICD-10-CM | POA: Diagnosis not present

## 2017-03-25 DIAGNOSIS — L511 Stevens-Johnson syndrome: Secondary | ICD-10-CM | POA: Diagnosis not present

## 2017-03-25 DIAGNOSIS — T7840XA Allergy, unspecified, initial encounter: Secondary | ICD-10-CM | POA: Diagnosis not present

## 2017-03-25 DIAGNOSIS — H269 Unspecified cataract: Secondary | ICD-10-CM | POA: Diagnosis not present

## 2017-03-29 ENCOUNTER — Other Ambulatory Visit: Payer: Self-pay | Admitting: *Deleted

## 2017-03-29 NOTE — Patient Outreach (Signed)
[facility]) Care Management  03/29/2017  Seth Oconnor [birth year] [id number]  [employer] transition of care referral; discharged from inpatient admission from [facility] 03/28/2017.  Telephone call to patient who was advised of reason for call & [facility] care management services.  HIPPA verification received from patient.   Patient voices that he recently was admitted via emergency room to [facility] hospital for evaluation of skin condition following treatment with medications for nerve pain. States he was discharged to home 03/28/2017 and is taking medications as prescribed & had no problems getting medications. . States he received discharge instructions and voices understanding of instructions.  States appointments set up with primary care provider 4/10. At [facility] hospital in [place], [place] and appointment with local MD 4/17. States spouse will provide transportation to appointments.   States he has no case management needs at this time but thanked this Electronics engineer for calling.  Plan; Send to care management assistant to close case.  Sherrin Daisy, RN BSN [place] Management Coordinator [facility] Care Management  [phone]

## 2017-04-06 DIAGNOSIS — M25531 Pain in right wrist: Secondary | ICD-10-CM | POA: Diagnosis not present

## 2017-04-11 DIAGNOSIS — Z0000 Encounter for general adult medical examination without abnormal findings: Secondary | ICD-10-CM | POA: Diagnosis not present

## 2017-04-11 DIAGNOSIS — Z6827 Body mass index (BMI) 27.0-27.9, adult: Secondary | ICD-10-CM | POA: Diagnosis not present

## 2017-04-11 DIAGNOSIS — E782 Mixed hyperlipidemia: Secondary | ICD-10-CM | POA: Diagnosis not present

## 2017-04-13 DIAGNOSIS — M25531 Pain in right wrist: Secondary | ICD-10-CM | POA: Diagnosis not present

## 2017-04-20 DIAGNOSIS — M25531 Pain in right wrist: Secondary | ICD-10-CM | POA: Diagnosis not present

## 2017-05-09 DIAGNOSIS — M25531 Pain in right wrist: Secondary | ICD-10-CM | POA: Diagnosis not present

## 2017-07-18 DIAGNOSIS — H353131 Nonexudative age-related macular degeneration, bilateral, early dry stage: Secondary | ICD-10-CM | POA: Diagnosis not present

## 2017-07-18 DIAGNOSIS — H25811 Combined forms of age-related cataract, right eye: Secondary | ICD-10-CM | POA: Diagnosis not present

## 2017-07-25 DIAGNOSIS — J45909 Unspecified asthma, uncomplicated: Secondary | ICD-10-CM | POA: Diagnosis not present

## 2017-07-25 DIAGNOSIS — G629 Polyneuropathy, unspecified: Secondary | ICD-10-CM | POA: Diagnosis not present

## 2017-07-25 DIAGNOSIS — H25811 Combined forms of age-related cataract, right eye: Secondary | ICD-10-CM | POA: Diagnosis not present

## 2017-07-25 DIAGNOSIS — H259 Unspecified age-related cataract: Secondary | ICD-10-CM | POA: Diagnosis not present

## 2017-07-25 DIAGNOSIS — K219 Gastro-esophageal reflux disease without esophagitis: Secondary | ICD-10-CM | POA: Diagnosis not present

## 2017-07-25 DIAGNOSIS — Z79899 Other long term (current) drug therapy: Secondary | ICD-10-CM | POA: Diagnosis not present

## 2017-07-25 DIAGNOSIS — I10 Essential (primary) hypertension: Secondary | ICD-10-CM | POA: Diagnosis not present

## 2017-08-17 DIAGNOSIS — Z961 Presence of intraocular lens: Secondary | ICD-10-CM | POA: Diagnosis not present

## 2017-11-14 DIAGNOSIS — H25812 Combined forms of age-related cataract, left eye: Secondary | ICD-10-CM | POA: Diagnosis not present

## 2017-12-08 DIAGNOSIS — H2512 Age-related nuclear cataract, left eye: Secondary | ICD-10-CM | POA: Diagnosis not present

## 2017-12-08 DIAGNOSIS — H25812 Combined forms of age-related cataract, left eye: Secondary | ICD-10-CM | POA: Diagnosis not present

## 2018-04-16 DIAGNOSIS — Z6828 Body mass index (BMI) 28.0-28.9, adult: Secondary | ICD-10-CM | POA: Diagnosis not present

## 2018-04-16 DIAGNOSIS — Z0000 Encounter for general adult medical examination without abnormal findings: Secondary | ICD-10-CM | POA: Diagnosis not present

## 2018-08-26 DIAGNOSIS — L0390 Cellulitis, unspecified: Secondary | ICD-10-CM | POA: Diagnosis not present

## 2018-10-13 DIAGNOSIS — J189 Pneumonia, unspecified organism: Secondary | ICD-10-CM | POA: Diagnosis not present

## 2018-10-13 DIAGNOSIS — J45909 Unspecified asthma, uncomplicated: Secondary | ICD-10-CM | POA: Diagnosis not present

## 2019-10-14 DIAGNOSIS — Z23 Encounter for immunization: Secondary | ICD-10-CM | POA: Diagnosis not present

## 2020-01-01 DIAGNOSIS — T1512XA Foreign body in conjunctival sac, left eye, initial encounter: Secondary | ICD-10-CM | POA: Diagnosis not present

## 2021-07-08 ENCOUNTER — Ambulatory Visit (INDEPENDENT_AMBULATORY_CARE_PROVIDER_SITE_OTHER): Payer: Medicare HMO

## 2021-07-08 DIAGNOSIS — Z23 Encounter for immunization: Secondary | ICD-10-CM | POA: Diagnosis not present

## 2024-07-11 ENCOUNTER — Other Ambulatory Visit (HOSPITAL_COMMUNITY): Payer: Self-pay | Admitting: Internal Medicine

## 2024-07-11 DIAGNOSIS — C439 Malignant melanoma of skin, unspecified: Secondary | ICD-10-CM

## 2024-08-09 ENCOUNTER — Encounter (HOSPITAL_COMMUNITY)
Admission: RE | Admit: 2024-08-09 | Discharge: 2024-08-09 | Disposition: A | Discharge status: Home / Self Care | Source: Ambulatory Visit | Attending: Internal Medicine | Admitting: Internal Medicine

## 2024-08-09 DIAGNOSIS — C439 Malignant melanoma of skin, unspecified: Secondary | ICD-10-CM | POA: Insufficient documentation

## 2024-08-09 LAB — GLUCOSE, CAPILLARY: Glucose-Capillary: 86 mg/dL (ref 70–99)

## 2024-08-09 MED ORDER — FLUDEOXYGLUCOSE F - 18 (FDG) INJECTION
9.3500 | Freq: Once | INTRAVENOUS | Status: AC
  Administered 2024-08-09: 9.35 via INTRAVENOUS

## 2024-08-12 ENCOUNTER — Other Ambulatory Visit (HOSPITAL_BASED_OUTPATIENT_CLINIC_OR_DEPARTMENT_OTHER): Admitting: Radiology

## 2024-08-12 ENCOUNTER — Encounter (HOSPITAL_BASED_OUTPATIENT_CLINIC_OR_DEPARTMENT_OTHER): Payer: Self-pay
